# Patient Record
Sex: Male | Born: 2005
Health system: Southern US, Community
[De-identification: ages and names within clinical notes are randomized; demographics above are authoritative.]

## PROBLEM LIST (undated history)

## (undated) DIAGNOSIS — R4689 Other symptoms and signs involving appearance and behavior: Secondary | ICD-10-CM

## (undated) DIAGNOSIS — J45909 Unspecified asthma, uncomplicated: Secondary | ICD-10-CM

## (undated) DIAGNOSIS — F3481 Disruptive mood dysregulation disorder: Secondary | ICD-10-CM

## (undated) DIAGNOSIS — F419 Anxiety disorder, unspecified: Secondary | ICD-10-CM

## (undated) DIAGNOSIS — F909 Attention-deficit hyperactivity disorder, unspecified type: Secondary | ICD-10-CM

## (undated) DIAGNOSIS — F329 Major depressive disorder, single episode, unspecified: Secondary | ICD-10-CM

## (undated) DIAGNOSIS — F32A Depression, unspecified: Secondary | ICD-10-CM

---
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
## 2023-02-03 ENCOUNTER — Ambulatory Visit: Payer: Medicaid Other | Admitting: Family Medicine

## 2023-02-06 ENCOUNTER — Telehealth: Payer: Self-pay

## 2023-02-06 ENCOUNTER — Encounter: Payer: Medicaid Other | Admitting: Family Medicine

## 2023-02-06 NOTE — Telephone Encounter (Signed)
Pt came in for visit 8/5/20204 and was advised we cannot accept [facility]. Pt left office. Pt mother, Rodney Booze, called in to speak with manager. She said she was not told this when appointment was scheduled. Apologized for the inconvenience. Advised that we accept many of the managed Medicaid plans. She will reach out to case worker to see if pt can be reassigned.

## 2023-02-07 NOTE — Progress Notes (Signed)
Erroneous enc
